# Patient Record
Sex: Male | Born: 1975 | Race: White | Hispanic: No | Marital: Married | Smoking: Current every day smoker
Health system: Southern US, Community
[De-identification: ages and names within clinical notes are randomized; demographics above are authoritative.]

## PROBLEM LIST (undated history)

## (undated) DIAGNOSIS — F17200 Nicotine dependence, unspecified, uncomplicated: Secondary | ICD-10-CM

## (undated) DIAGNOSIS — J309 Allergic rhinitis, unspecified: Secondary | ICD-10-CM

## (undated) DIAGNOSIS — L309 Dermatitis, unspecified: Secondary | ICD-10-CM

## (undated) HISTORY — DX: Allergic rhinitis, unspecified: J30.9

## (undated) HISTORY — DX: Dermatitis, unspecified: L30.9

## (undated) HISTORY — PX: WISDOM TOOTH EXTRACTION: SHX21

## (undated) HISTORY — DX: Nicotine dependence, unspecified, uncomplicated: F17.200

---
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
## 2008-03-12 ENCOUNTER — Emergency Department: Payer: Self-pay | Admitting: Emergency Medicine

## 2008-09-15 IMAGING — CR NECK SOFT TISSUES - 1+ VIEW
1 series · 2 of 2 positions shown · non-contrast
Comparison: none

REASON FOR EXAM: trouble swallowing  -  mc2
COMMENTS:   LMP: (Male)

PROCEDURE:     DXR - DXR SOFT TISSUE NECK  - March 12, 2008  [DATE]
RESULT:     Comparison: No available comparison exam.

[Series 1: view not recorded · 0.17mm/px · 2 of 2 slices shown]
[im 1/2]
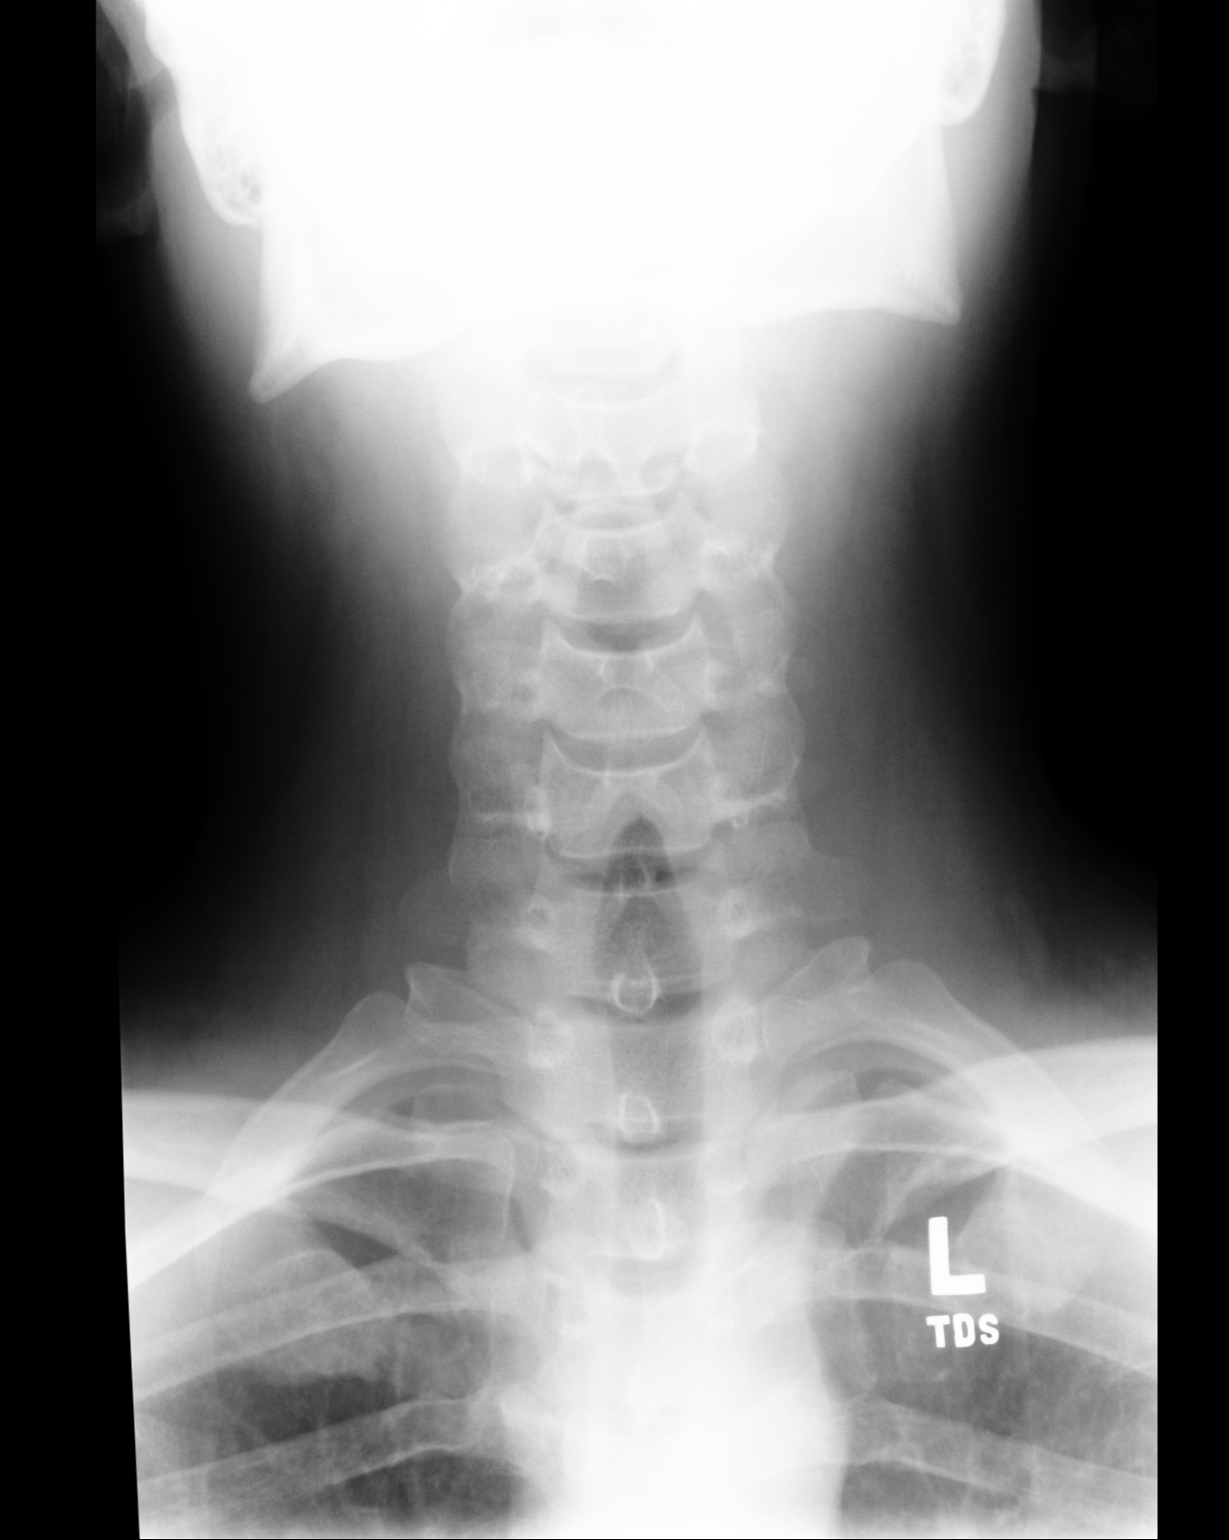
[im 2/2]
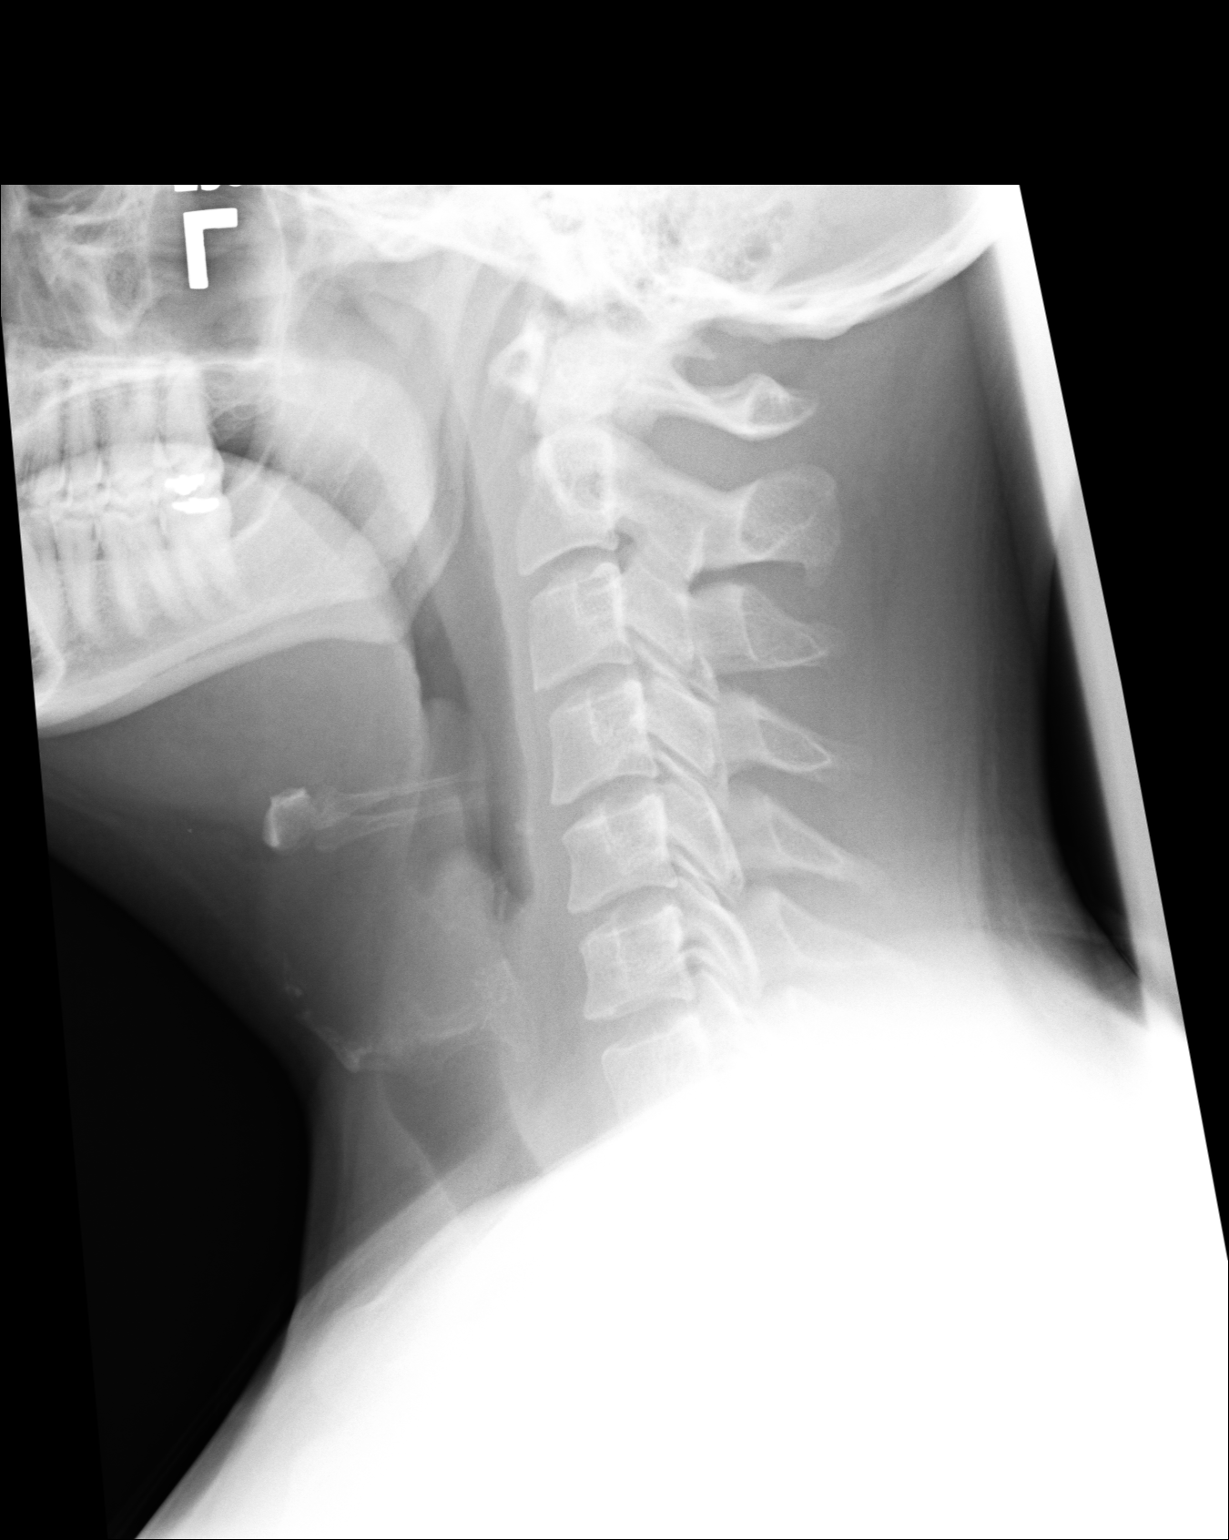

[2 of 2 positions shown; findings below may reference images not displayed]

FINDINGS: The epiglottis is prominent. This is concerning for epiglottitis. There is
no gross prevertebral soft tissue swelling. No radiopaque foreign body is
noted.
IMPRESSION: 1. The epiglottis is prominent. This is concerning for epiglottitis. If
there is clinical concern for neck soft tissue abscess, consider further
evaluation with contrast neck CT.

Findings were discussed with ER charge nurse at [DATE] on 03/12/08.

## 2011-07-08 ENCOUNTER — Inpatient Hospital Stay (INDEPENDENT_AMBULATORY_CARE_PROVIDER_SITE_OTHER)
Admission: RE | Admit: 2011-07-08 | Discharge: 2011-07-08 | Disposition: A | Discharge status: Home / Self Care | Payer: 59 | Source: Ambulatory Visit | Attending: Family Medicine | Admitting: Family Medicine

## 2011-07-08 DIAGNOSIS — J029 Acute pharyngitis, unspecified: Secondary | ICD-10-CM

## 2011-07-08 LAB — POCT RAPID STREP A: Streptococcus, Group A Screen (Direct): NEGATIVE

## 2013-07-31 ENCOUNTER — Emergency Department (HOSPITAL_COMMUNITY)
Admission: EM | Admit: 2013-07-31 | Discharge: 2013-07-31 | Disposition: A | Discharge status: Home / Self Care | Payer: 59 | Source: Home / Self Care

## 2013-08-01 ENCOUNTER — Encounter: Payer: Self-pay | Admitting: Medical

## 2013-08-01 ENCOUNTER — Ambulatory Visit (INDEPENDENT_AMBULATORY_CARE_PROVIDER_SITE_OTHER): Payer: 59 | Admitting: Medical

## 2013-08-01 VITALS — BP 120/78 | HR 60 | Temp 97.8°F | Resp 14 | Wt 207.0 lb

## 2013-08-01 DIAGNOSIS — R5381 Other malaise: Secondary | ICD-10-CM

## 2013-08-01 DIAGNOSIS — R42 Dizziness and giddiness: Secondary | ICD-10-CM

## 2013-08-01 DIAGNOSIS — J329 Chronic sinusitis, unspecified: Secondary | ICD-10-CM

## 2013-08-01 MED ORDER — AMOXICILLIN 875 MG PO TABS: 875.0000 mg | ORAL_TABLET | Freq: Two times a day (BID) | ORAL | Status: DC

## 2013-08-01 NOTE — Progress Notes (Signed)
Subjective: Here as a new patient.   Wife Denny Peon is a patient here.   He wants to establish care.  Been getting more sinus infections the past few years compared to prior.  Last week was on a cruise.  Towards the end of the week, started getting sore throat, thick yellow mucous drainage, sinus pressure, fatigue.  Normally colds he can shake in 5 days, but this is persistent.  Feels like mucous is getting into his lungs. Having worsening cough, worse sinus pressure.   Last week was on a cruise for vacation, was going at it hard, over did it some.  He and wife had some time to enjoy themselves with his parents watching their kids.  Did more drinking, entertainment, less sleep.  Just flew back into town yesterday.  Busy getting back into the swing of things.   Had been staying up late this past week, particularly with laundry, cleaning.  Usually walks at lunch, but at lunch today, felt out of sorts, felt uneasy, slight dizziness, off balance.   Even felt tremor of hand.  No other aggravating or relieving factors.   Past Medical History  Diagnosis Date  . Eczema   . Allergic rhinitis    Review of Systems Constitutional: -fever, -chills, -sweats Cardiology:  -chest pain, -palpitations, -edema Respiratory:  -shortness of breath, -wheezing Gastroenterology: -abdominal pain, -nausea, -vomiting, -diarrhea, -constipation  Hematology: -bleeding or bruising problems Musculoskeletal: -arthralgias, -myalgias, -joint swelling, -back pain Ophthalmology: -vision changes Urology: -dysuria, -difficulty urinating, -hematuria, -urinary frequency, -urgency Neurology:  -weakness, -tingling, -numbness     Objective:   Physical Exam  Filed Vitals:   08/01/13 1514  BP: 120/78  Pulse: 60  Temp: 97.8 F (36.6 C)  Resp: 14    General appearance: alert, no distress, WD/WN  HEENT: normocephalic, sclerae anicteric, PERRLA, EOMi, nontender over sinuses, nares with swollen turbinates, clear discharge, pharynx  normal Oral cavity: MMM, no lesions Neck: supple, no lymphadenopathy, no thyromegaly, no masses Heart: RRR, normal S1, S2, no murmurs Lungs: CTA bilaterally, no wheezes, rhonchi, or rales Extremities: no edema, no cyanosis, no clubbing Pulses: 2+ symmetric, upper and lower extremities, normal cap refill Neurological: alert, oriented x 3, CN2-12 intact, nonfocal exam Psychiatric: normal affect, behavior normal, pleasant    Assessment and Plan :     Encounter Diagnoses  Name Primary?  . Sinusitis Yes  . Dizziness and giddiness   . Other malaise and fatigue    Discussed concerns, symptoms, diagnosis.  Will treat for sinus infection.   Advised he begin Mucinex OTC ,rest, hydrate well, amoxicillin, nasal saline, and if worse or not improving, recheck.   Advised he cut back some on alcohol intake.

## 2013-08-01 NOTE — Patient Instructions (Signed)
Begin Amoxicillin twice daily for 10 days.  Rest, increase water intake.    Begin Mucinex or Mucinex DM OTC twice daily x 5-7 days.  Consider OTC benadryl or Dramamine if staying dizzy.    If not improving or worse in the next 5-7 days, call or return.    Sinusitis Sinuses are air pockets within the bones of your face. The growth of bacteria within a sinus leads to infection. Infection keeps the sinuses from draining. This infection is called sinusitis. SYMPTOMS There will be different areas of pain depending on which sinuses have become infected.  The maxillary sinuses often produce pain beneath the eyes.   Frontal sinusitis may cause pain in the middle of the forehead and above the eyes.  Other problems (symptoms) include:  Toothaches.   Colored, pus-like (purulent) drainage from the nose.   Any swelling, warmth, or tenderness over the sinus areas may be signs of infection.  TREATMENT Sinusitis is most often determined by an exam and you may have x-rays taken. If x-rays have been taken, make sure you obtain your results. Or find out how you are to obtain them. Your caregiver may give you medications (antibiotics). These are medications that will help kill the infection. You may also be given a medication (decongestant) that helps to reduce sinus swelling.  HOME CARE INSTRUCTIONS  Only take over-the-counter or prescription medicines for pain, discomfort, or fever as directed by your caregiver.   Drink extra fluids. Fluids help thin the mucus so your sinuses can drain more easily.   Applying either moist heat or ice packs to the sinus areas may help relieve discomfort.   Use saline nasal sprays to help moisten your sinuses. The sprays can be found at your local drugstore.  SEEK IMMEDIATE MEDICAL CARE IF YOU DEVELOP:  High fever that is still present after two days of antibiotic treatment.   Increasing pain, severe headaches, or toothache.   Nausea, vomiting, or drowsiness.    Unusual swelling around the face or trouble seeing.  MAKE SURE YOU:   Understand these instructions.   Will watch your condition.   Will get help right away if you are not doing well or get worse.  Document Released: 12/14/2005 Document Re-Released: 11/26/2008 [facility] Patient Information 2011 [place], [place].

## 2014-08-02 ENCOUNTER — Ambulatory Visit (INDEPENDENT_AMBULATORY_CARE_PROVIDER_SITE_OTHER): Payer: 59 | Admitting: Medical

## 2014-08-02 ENCOUNTER — Telehealth: Payer: Self-pay | Admitting: Medical

## 2014-08-02 ENCOUNTER — Encounter: Payer: Self-pay | Admitting: Medical

## 2014-08-02 VITALS — BP 110/70 | HR 64 | Temp 97.6°F | Resp 15 | Ht 69.0 in | Wt 189.0 lb

## 2014-08-02 DIAGNOSIS — K921 Melena: Secondary | ICD-10-CM

## 2014-08-02 DIAGNOSIS — L80 Vitiligo: Secondary | ICD-10-CM

## 2014-08-02 DIAGNOSIS — L309 Dermatitis, unspecified: Secondary | ICD-10-CM

## 2014-08-02 DIAGNOSIS — Z0000 Encounter for general adult medical examination without abnormal findings: Secondary | ICD-10-CM

## 2014-08-02 DIAGNOSIS — Z23 Encounter for immunization: Secondary | ICD-10-CM

## 2014-08-02 DIAGNOSIS — F17200 Nicotine dependence, unspecified, uncomplicated: Secondary | ICD-10-CM

## 2014-08-02 DIAGNOSIS — L259 Unspecified contact dermatitis, unspecified cause: Secondary | ICD-10-CM

## 2014-08-02 DIAGNOSIS — L989 Disorder of the skin and subcutaneous tissue, unspecified: Secondary | ICD-10-CM

## 2014-08-02 LAB — POCT URINALYSIS DIPSTICK
Bilirubin, UA: NEGATIVE
Blood, UA: NEGATIVE
Glucose, UA: NEGATIVE
KETONES UA: NEGATIVE
LEUKOCYTES UA: NEGATIVE
Nitrite, UA: NEGATIVE
PH UA: 5
PROTEIN UA: NEGATIVE
Spec Grav, UA: 1.025
Urobilinogen, UA: NEGATIVE

## 2014-08-02 LAB — COMPREHENSIVE METABOLIC PANEL
ALT: 25 U/L (ref 0–53)
AST: 22 U/L (ref 0–37)
Albumin: 4.6 g/dL (ref 3.5–5.2)
Alkaline Phosphatase: 63 U/L (ref 39–117)
BILIRUBIN TOTAL: 0.6 mg/dL (ref 0.2–1.2)
BUN: 12 mg/dL (ref 6–23)
CO2: 28 meq/L (ref 19–32)
CREATININE: 0.96 mg/dL (ref 0.50–1.35)
Calcium: 9.6 mg/dL (ref 8.4–10.5)
Chloride: 104 mEq/L (ref 96–112)
GLUCOSE: 90 mg/dL (ref 70–99)
Potassium: 4.3 mEq/L (ref 3.5–5.3)
Sodium: 142 mEq/L (ref 135–145)
Total Protein: 7 g/dL (ref 6.0–8.3)

## 2014-08-02 LAB — CBC
HCT: 45.1 % (ref 39.0–52.0)
HEMOGLOBIN: 15.6 g/dL (ref 13.0–17.0)
MCH: 30.9 pg (ref 26.0–34.0)
MCHC: 34.6 g/dL (ref 30.0–36.0)
MCV: 89.3 fL (ref 78.0–100.0)
Platelets: 292 10*3/uL (ref 150–400)
RBC: 5.05 MIL/uL (ref 4.22–5.81)
RDW: 14 % (ref 11.5–15.5)
WBC: 7.3 10*3/uL (ref 4.0–10.5)

## 2014-08-02 LAB — LIPID PANEL
CHOL/HDL RATIO: 3.2 ratio
CHOLESTEROL: 208 mg/dL — AB (ref 0–200)
HDL: 66 mg/dL (ref >39)
LDL Cholesterol: 123 mg/dL — ABNORMAL HIGH (ref 0–99)
TRIGLYCERIDES: 93 mg/dL (ref <150)
VLDL: 19 mg/dL (ref 0–40)

## 2014-08-02 NOTE — Progress Notes (Signed)
Subjective:   HPI  Darrell Delgado is a 38 y.o. male who presents for a complete physical.   Preventative care:years ago Last ophthalmology visit: never Last dental visit:years ago , no dentist Last colonoscopy:never Last prostate exam:never  Last ZOX:WRUEA Last labs:years ago  Prior vaccinations: TD or Tdap:unsure if current Influenza:2014 Pneumococcal:no Shingles/Zostavax:no  Advanced directive:no Health care power of attorney:no Living will:no  Concerns: Has some moles and skin changes he wants checked out.   Reviewed their medical, surgical, family, social, medication, and allergy history and updated chart as appropriate.  Past Medical History  Diagnosis Date  . Eczema   . Allergic rhinitis   . Smoker     Past Surgical History  Procedure Laterality Date  . Wisdom tooth extraction    . Orif metatarsal fracture  1996    right, Dr. Anette Riedel, [place]    History   Social History  . Marital Status: Married    Spouse Name: N/A    Number of Children: N/A  . Years of Education: N/A   Occupational History  . Not on file.   Social History Main Topics  . Smoking status: Current Every Day Smoker -- 0.50 packs/day for 7 years    Types: Cigarettes  . Smokeless tobacco: Not on file  . Alcohol Use: 7.2 oz/week    12 Cans of beer per week  . Drug Use: No  . Sexual Activity: Not on file   Other Topics Concern  . Not on file   Social History Narrative   5-6 days per week, 45 min, running.  Married, 2 children, 5yo and 2yo as of 07/2014, underwriting for loans, Administrator, sports.      Family History  Problem Relation Age of Onset  . Asthma Mother   . Heart disease Neg Hx   . Stroke Neg Hx   . Diabetes Neg Hx   . Hypertension Neg Hx   . Cancer Other     maternal side with some colon cancer    No current outpatient prescriptions on file.  No Known Allergies    Review of Systems Constitutional: -fever, -chills, -sweats, -unexpected weight change, -decreased  appetite, -fatigue Allergy: -sneezing, -itching, -congestion Dermatology: +changing moles, +rash, -lumps ENT: -runny nose, -ear pain, -sore throat, -hoarseness, -sinus pain, -teeth pain, - ringing in ears, -hearing loss, -nosebleeds Cardiology: -chest pain, -palpitations, -swelling, -difficulty breathing when lying flat, -waking up short of breath Respiratory: -cough, -shortness of breath, -difficulty breathing with exercise or exertion, -wheezing, -coughing up blood Gastroenterology: -abdominal pain, -nausea, -vomiting, -diarrhea, -constipation,+blood in stool, +changes in bowel movement, -difficulty swallowing or eating Hematology: -bleeding, -bruising  Musculoskeletal: -joint aches, -muscle aches, -joint swelling, -back pain, -neck pain, -cramping, -changes in gait Ophthalmology: denies vision changes, eye redness, itching, discharge Urology: +burning with urination, -difficulty urinating, -blood in urine, -urinary frequency, -urgency, -incontinence Neurology: -headache, -weakness, -tingling, -numbness, -memory loss, -falls, +dizziness Psychology: -depressed mood, -agitation, -sleep problems     Objective:   Physical Exam  BP 110/70  Pulse 64  Temp(Src) 97.6 F (36.4 C) (Oral)  Resp 15  Ht 5\' 9"  (1.753 m)  Wt 189 lb (85.73 kg)  BMI 27.90 kg/m2  General appearance: alert, no distress, WD/WN, white male Skin: 1 small pedunculated skin tag right neck, 2 small pedunculated skin tag left neck, small flat patch of white coloration 5 mm x 6 mm suggestive of vitiligo, similar circumferential hypopigmentation of the distal skin of the penis, suggestive of vitiligo, there are other scattered benign-appearing  macules, no other worrisome lesions HEENT: normocephalic, conjunctiva/corneas normal, sclerae anicteric, PERRLA, EOMi, nares patent, no discharge or erythema, pharynx normal Oral cavity: MMM, tongue normal, teeth in good repair Neck: supple, no lymphadenopathy, no thyromegaly, no masses,  normal ROM, no bruits Chest: non tender, normal shape and expansion Heart: RRR, normal S1, S2, no murmurs Lungs: CTA bilaterally, no wheezes, rhonchi, or rales Abdomen: +bs, soft, non tender, non distended, no masses, no hepatomegaly, no splenomegaly, no bruits Back: non tender, normal ROM, no scoliosis Musculoskeletal: right lateral foot with 2cm scar from prior surgery, upper extremities non tender, no obvious deformity, normal ROM throughout, lower extremities non tender, no obvious deformity, normal ROM throughout Extremities: no edema, no cyanosis, no clubbing Pulses: 2+ symmetric, upper and lower extremities, normal cap refill Neurological: alert, oriented x 3, CN2-12 intact, strength normal upper extremities and lower extremities, sensation normal throughout, DTRs 2+ throughout, no cerebellar signs, gait normal Psychiatric: normal affect, behavior normal, pleasant  GU: normal male external genitalia, circumcised, nontender, no masses, no hernia, no lymphadenopathy Rectal: anus normal appearing, no lesions   Assessment and Plan :    Encounter Diagnoses  Name Primary?  . Routine general medical examination at a health care facility Yes  . Need for Tdap vaccination   . Eczema   . Smoker   . Blood in stool   . Vitiligo   . Skin lesion    Physical exam - discussed healthy lifestyle, diet, exercise, preventative care, vaccinations, and addressed their concerns.    Counseled on the Tdap (tetanus, diptheria, and acellular pertussis) vaccine.  Vaccine information sheet given. Tdap vaccine given after consent obtained.  Eczema - advised daily moisturizing lotion, prevention, consider antihistamine in allergy season if skin flares up during those times  Smoker - discussed risk of tobacco strongly encouraged him and his wife both quit. He is contemplating quitting again  Blood in stool - we discussed his occasional bright red blood which is likely due to hemorrhoids. Advise he check  with family to get more history about the colon cancer in the family. We discussed possible GI referral if this continues or gets more frequent, particularly dark red blood or blood mixed in with stool. He will do stool cards for now  Vitiligo/skin lesions - will refer to dermatology  Follow-up pending labs

## 2014-08-02 NOTE — Patient Instructions (Signed)
  Thank you for giving me the opportunity to serve you today.    Your diagnosis today includes: Encounter Diagnoses  Name Primary?  . Routine general medical examination at a health care facility Yes  . Need for Tdap vaccination   . Eczema   . Smoker   . Blood in stool   . Vitiligo   . Skin lesion      Specific recommendations today include:  We updated your tetanus, diphtheria, and pertussis vaccine today  We'll call with lab results  I will refer you to dermatology for the vitiligo  Please return the stool cards we can check for blood  Please check on more detail about her family's colon cancer history, who had it, what was their age at diagnosis, any genetic testing done?  Make sure you're getting good amount of fiber and water intake in your diet  Stop tobacco!!! Consider using 1 800 quit now hotline to help stop tobacco  Return pending labs.

## 2014-08-02 NOTE — Telephone Encounter (Signed)
Refer to dermatology/Dr. Lupton's office for multiple skin concerns, vitiligo.

## 2014-08-02 NOTE — Telephone Encounter (Signed)
Scheduled dermatology appointment with Dr. Terri PiedraLupton Sept 9th @ 2:10, be there 2, with copay. LM on patients VM.

## 2014-08-02 NOTE — Addendum Note (Signed)
Addended by: Lilli LightLOMAX, WENDY G on: 08/02/2014 11:17 AM   Modules accepted: Orders

## 2014-10-25 ENCOUNTER — Telehealth: Payer: Self-pay | Admitting: Medical

## 2014-10-25 NOTE — Telephone Encounter (Signed)
Do we have openings to work him in tomorrow

## 2014-10-26 NOTE — Telephone Encounter (Signed)
Foot soaks in epsom salt hot/warm water, topically antibiotic ointment, leg elevation, Ibuprofen OTC 2-3 tablet every 8 hours for pain.

## 2014-10-26 NOTE — Telephone Encounter (Signed)
Patient is aware of Darrell Delgado PAC recommendations 

## 2014-10-26 NOTE — Telephone Encounter (Signed)
See note, Pt is not available to come in til Monday and has appt then.  Wants to know what he can do at home until then

## 2014-10-26 NOTE — Telephone Encounter (Signed)
LMOM TO CB. CLS 

## 2014-10-29 ENCOUNTER — Encounter: Payer: Self-pay | Admitting: Medical

## 2014-10-29 ENCOUNTER — Ambulatory Visit (INDEPENDENT_AMBULATORY_CARE_PROVIDER_SITE_OTHER): Payer: 59 | Admitting: Medical

## 2014-10-29 VITALS — BP 112/70 | HR 80 | Temp 97.8°F | Resp 16 | Wt 186.0 lb

## 2014-10-29 DIAGNOSIS — L600 Ingrowing nail: Secondary | ICD-10-CM

## 2014-10-29 DIAGNOSIS — L259 Unspecified contact dermatitis, unspecified cause: Secondary | ICD-10-CM

## 2014-10-29 MED ORDER — CEPHALEXIN 500 MG PO CAPS: 500.0000 mg | ORAL_CAPSULE | Freq: Three times a day (TID) | ORAL | Status: DC

## 2014-10-29 NOTE — Progress Notes (Signed)
Subjective: Here for 2 issues today  No prior history of ingrown toenail but in the last week has had left great toenail ingrowing red and swollen. Over the weekend he was trying to manipulate the nail and was able to get the nail piece to break free from the toe and within an hour felt improvement with swelling and redness. Has had some pus drainage. No fever no nausea vomiting no numbness or tingling of feet  Has rash on top of penis x few days.  No particular new contact with chemical, no prior similar rash.  No hx/o STD.  Is married, monogamous, no concern for STD . No other GU symptoms.    ROS as in subjective   Objective: Gen:wd, wn, nad Skin:distal dorsal penis shaft with linear pink slightly raised 1.5 cm x 2 mm rash, no other rash, left medial great toe nail bed with slightly swollen quarter of nail bed puffy and swollen and slightly mildly tender   Assessment: Encounter Diagnoses  Name Primary?  . Ingrown toenail Yes  . Contact dermatitis      Plan: Ingrown toenail-improving, advise Epsom salt water soaks, manipulating the nailbed to keep the toenail from ingrowing, and if any additional redness or pus begin Keflex antibiotic.  Return if not improving  Contact dermatitis - can use OTC hydrocortisone cream for the next 5- 7 days.   Call if not fully resolving or if worse or no improvement.

## 2014-10-31 ENCOUNTER — Ambulatory Visit: Payer: 59 | Admitting: Medical

## 2015-02-06 ENCOUNTER — Ambulatory Visit (INDEPENDENT_AMBULATORY_CARE_PROVIDER_SITE_OTHER): Payer: 59 | Admitting: Medical

## 2015-02-06 DIAGNOSIS — M7751 Other enthesopathy of right foot: Secondary | ICD-10-CM

## 2015-02-06 MED ORDER — NAPROXEN 375 MG PO TABS: 375.0000 mg | ORAL_TABLET | Freq: Two times a day (BID) | ORAL | Status: AC

## 2015-02-06 NOTE — Patient Instructions (Addendum)
  Thank you for giving me the opportunity to serve you today.    Your diagnosis today includes: Encounter Diagnosis  Name Primary?  . Retrocalcaneal bursitis (back of heel), right      Specific recommendations today include:  Begin Naprosyn twice daily for 7-14 days for inflammation  Use ice to the heel nightly for 20 minutes each  Consider ACE wrap to the ankle for the next week  Over deep ankle stretching like downward facing dog  Avoid doing a lot of hopping or stretching that puts you in the position that hurts  Be cautious with running and P90X for the next 1-2 weeks   Return in about 1 month (around 03/07/2015).    I have included other useful information below for your review.   Bursitis Bursitis is a swelling and soreness (inflammation) of a fluid-filled sac (bursa) that overlies and protects a joint. It can be caused by injury, overuse of the joint, arthritis or infection. The joints most likely to be affected are the elbows, shoulders, hips and knees. HOME CARE INSTRUCTIONS   Apply ice to the affected area for 15-20 minutes each hour while awake for 2 days. Put the ice in a plastic bag and place a towel between the bag of ice and your skin.  Rest the injured joint as much as possible, but continue to put the joint through a full range of motion, 4 times per day. (The shoulder joint especially becomes rapidly "frozen" if not used.) When the pain lessens, begin normal slow movements and usual activities.  Only take over-the-counter or prescription medicines for pain, discomfort or fever as directed by your caregiver.  Your caregiver may recommend draining the bursa and injecting medicine into the bursa. This may help the healing process.  Follow all instructions for follow-up with your caregiver. This includes any orthopedic referrals, physical therapy and rehabilitation. Any delay in obtaining necessary care could result in a delay or failure of the bursitis to  heal and chronic pain. SEEK IMMEDIATE MEDICAL CARE IF:   Your pain increases even during treatment.  You develop an oral temperature above 102 F (38.9 C) and have heat and inflammation over the involved bursa. MAKE SURE YOU:   Understand these instructions.  Will watch your condition.  Will get help right away if you are not doing well or get worse. Document Released: 12/11/2000 Document Revised: 03/07/2012 Document Reviewed: 03/05/2014 [facility] Patient Information 2015 [place]. This information is not intended to replace advice given to you by your health care provider. Make sure you discuss any questions you have with your health care provider.

## 2015-02-06 NOTE — Progress Notes (Signed)
Subjective: Here for foot/ankle problem.   Been having problems since mid December.   Still able to do work outs, but can't do all of his normal activity given the pains.   Denies specific injury or trauma.  First time he felt it he was doing downward dog yoga pose.  As he was going up to come out of the pose, felt a sting in the right achilles/burning sensation.  He stopped what he was doing.   Completed rest of his stretches, but was careful.   Since then would have that same stinging with other workouts.  So he did some light workouts for the next week or 2.  After reading on Internet, read about heel spurs, achilles tendonitis, plantar fascitis.  Currently can't do his Yoga workout due to the pain.  Currently exercising 4-5 days per week.   He is doing mostly upper body, some karate stuff, but not doing a lot of running , jumping, or anything that pulls on the achilles or foot.   Using nothing for pain, no ice, no swelling, no numbness or tingling.  He has fracture 5th metatarsal twice in the past of right foot.   He does report prior clinical diagnosis of calve muscle tear/partial tear, was on crutches for 2mo.   No prior similar prior.  No other aggravating or relieving factors. No other complaint.   Objective: BP 110/80 mmHg  Pulse 79  Resp 16  Wt 187 lb (84.823 kg)  Gen: wd, wn, and Skin:feet are cool, otherwise unremarkable of feet/legs, no erythema, no ecchymosis MSK: nontender to palpation, no obvious deformity, slight fullness of right retrocalcaneal bursa compared to left, slight pain with resisted right achilles with dorsiflexion and plantarflexion Pulses: 2+ bilat pedal, cap refill slightly reduced in general bilat Ext: no edema, cyanosis or clubbing Neuro: normal feet and leg and toe strength and sensation  Assessment: Encounter Diagnosis  Name Primary?  . Retrocalcaneal bursitis (back of heel), right      Plan: Dr. Susann GivensLalonde supervising physician also examined. Discussed  diagnosis, treatment recommendations, advise 1-2 weeks of relative rest, avoid deep yoga poses or anything that over plantar flexes the right foot, ice daily at bedtime, begin Naprosyn 1-2 weeks, consider compression such as Ace wrap, follow-up of not major improvement in 1-2 weeks
